# Patient Record
Sex: Female | Born: 1945 | Race: White | Hispanic: No | Smoking: Never smoker
Health system: Southern US, Community
[De-identification: ages and names within clinical notes are randomized; demographics above are authoritative.]

## PROBLEM LIST (undated history)

## (undated) DIAGNOSIS — Z86010 Personal history of colonic polyps: Secondary | ICD-10-CM

## (undated) DIAGNOSIS — K2900 Acute gastritis without bleeding: Secondary | ICD-10-CM

## (undated) DIAGNOSIS — M109 Gout, unspecified: Secondary | ICD-10-CM

## (undated) DIAGNOSIS — T7840XA Allergy, unspecified, initial encounter: Secondary | ICD-10-CM

## (undated) DIAGNOSIS — K5790 Diverticulosis of intestine, part unspecified, without perforation or abscess without bleeding: Secondary | ICD-10-CM

## (undated) DIAGNOSIS — E785 Hyperlipidemia, unspecified: Secondary | ICD-10-CM

## (undated) DIAGNOSIS — K449 Diaphragmatic hernia without obstruction or gangrene: Secondary | ICD-10-CM

## (undated) DIAGNOSIS — Z860101 Personal history of adenomatous and serrated colon polyps: Secondary | ICD-10-CM

## (undated) DIAGNOSIS — N6019 Diffuse cystic mastopathy of unspecified breast: Secondary | ICD-10-CM

## (undated) HISTORY — DX: Diverticulosis of intestine, part unspecified, without perforation or abscess without bleeding: K57.90

## (undated) HISTORY — DX: Allergy, unspecified, initial encounter: T78.40XA

## (undated) HISTORY — DX: Personal history of colonic polyps: Z86.010

## (undated) HISTORY — DX: Gout, unspecified: M10.9

## (undated) HISTORY — DX: Acute gastritis without bleeding: K29.00

## (undated) HISTORY — PX: COLONOSCOPY W/ POLYPECTOMY: SHX1380

## (undated) HISTORY — DX: Hyperlipidemia, unspecified: E78.5

## (undated) HISTORY — DX: Diaphragmatic hernia without obstruction or gangrene: K44.9

## (undated) HISTORY — DX: Diffuse cystic mastopathy of unspecified breast: N60.19

## (undated) HISTORY — DX: Personal history of adenomatous and serrated colon polyps: Z86.0101

## (undated) HISTORY — PX: EXPLORATORY LAPAROTOMY: SUR591

## (undated) HISTORY — PX: OTHER SURGICAL HISTORY: SHX169

## (undated) HISTORY — PX: COLONOSCOPY: SHX174

---

## 1998-09-07 ENCOUNTER — Other Ambulatory Visit: Admission: RE | Admit: 1998-09-07 | Discharge: 1998-09-07 | Payer: Self-pay | Admitting: Internal Medicine

## 2001-06-04 ENCOUNTER — Other Ambulatory Visit: Admission: RE | Admit: 2001-06-04 | Discharge: 2001-06-04 | Payer: Self-pay | Admitting: Gynecology

## 2002-06-10 ENCOUNTER — Other Ambulatory Visit: Admission: RE | Admit: 2002-06-10 | Discharge: 2002-06-10 | Payer: Self-pay | Admitting: Gynecology

## 2003-06-08 ENCOUNTER — Other Ambulatory Visit: Admission: RE | Admit: 2003-06-08 | Discharge: 2003-06-08 | Payer: Self-pay | Admitting: Gynecology

## 2004-01-10 ENCOUNTER — Encounter: Admission: RE | Admit: 2004-01-10 | Discharge: 2004-01-10 | Payer: Self-pay | Admitting: Family Medicine

## 2004-02-10 ENCOUNTER — Ambulatory Visit: Payer: Self-pay | Admitting: Internal Medicine

## 2004-06-26 ENCOUNTER — Ambulatory Visit: Payer: Self-pay | Admitting: Internal Medicine

## 2004-06-26 ENCOUNTER — Other Ambulatory Visit: Admission: RE | Admit: 2004-06-26 | Discharge: 2004-06-26 | Payer: Self-pay | Admitting: Gynecology

## 2005-01-04 ENCOUNTER — Ambulatory Visit: Payer: Self-pay | Admitting: Internal Medicine

## 2005-01-04 ENCOUNTER — Encounter: Admission: RE | Admit: 2005-01-04 | Discharge: 2005-01-04 | Payer: Self-pay | Admitting: Internal Medicine

## 2005-01-18 ENCOUNTER — Ambulatory Visit: Payer: Self-pay | Admitting: Internal Medicine

## 2005-07-31 ENCOUNTER — Other Ambulatory Visit: Admission: RE | Admit: 2005-07-31 | Discharge: 2005-07-31 | Payer: Self-pay | Admitting: Gynecology

## 2006-02-06 ENCOUNTER — Ambulatory Visit: Payer: Self-pay | Admitting: Internal Medicine

## 2006-04-01 HISTORY — PX: UPPER GI ENDOSCOPY: SHX6162

## 2006-08-19 ENCOUNTER — Other Ambulatory Visit: Admission: RE | Admit: 2006-08-19 | Discharge: 2006-08-19 | Payer: Self-pay | Admitting: Gynecology

## 2006-09-01 ENCOUNTER — Encounter: Payer: Self-pay | Admitting: Internal Medicine

## 2006-09-09 ENCOUNTER — Ambulatory Visit: Payer: Self-pay | Admitting: Internal Medicine

## 2006-09-11 ENCOUNTER — Ambulatory Visit: Payer: Self-pay | Admitting: Internal Medicine

## 2006-09-11 LAB — CONVERTED CEMR LAB
ALT: 29 units/L (ref 0–40)
AST: 27 units/L (ref 0–37)
Albumin: 3.5 g/dL (ref 3.5–5.2)
Alkaline Phosphatase: 74 units/L (ref 39–117)
BUN: 8 mg/dL (ref 6–23)
Basophils Absolute: 0 10*3/uL (ref 0.0–0.1)
Basophils Relative: 0.6 % (ref 0.0–1.0)
Bilirubin, Direct: 0.1 mg/dL (ref 0.0–0.3)
CO2: 31 meq/L (ref 19–32)
Calcium: 9.4 mg/dL (ref 8.4–10.5)
Chloride: 105 meq/L (ref 96–112)
Creatinine, Ser: 0.9 mg/dL (ref 0.4–1.2)
Eosinophils Absolute: 0.1 10*3/uL (ref 0.0–0.6)
Eosinophils Relative: 2.5 % (ref 0.0–5.0)
GFR calc Af Amer: 83 mL/min
GFR calc non Af Amer: 68 mL/min
Glucose, Bld: 83 mg/dL (ref 70–99)
HCT: 37.5 % (ref 36.0–46.0)
Hemoglobin: 13.1 g/dL (ref 12.0–15.0)
Lymphocytes Relative: 33.1 % (ref 12.0–46.0)
MCHC: 35 g/dL (ref 30.0–36.0)
MCV: 88.5 fL (ref 78.0–100.0)
Monocytes Absolute: 0.4 10*3/uL (ref 0.2–0.7)
Monocytes Relative: 7.6 % (ref 3.0–11.0)
Neutro Abs: 3.2 10*3/uL (ref 1.4–7.7)
Neutrophils Relative %: 56.2 % (ref 43.0–77.0)
Platelets: 289 10*3/uL (ref 150–400)
Potassium: 3.8 meq/L (ref 3.5–5.1)
RBC: 4.23 M/uL (ref 3.87–5.11)
RDW: 12.2 % (ref 11.5–14.6)
Sodium: 141 meq/L (ref 135–145)
TSH: 2.26 microintl units/mL (ref 0.35–5.50)
Total Bilirubin: 0.6 mg/dL (ref 0.3–1.2)
Total Protein: 6.6 g/dL (ref 6.0–8.3)
WBC: 5.6 10*3/uL (ref 4.5–10.5)

## 2006-09-18 LAB — CONVERTED CEMR LAB: Vit D, 1,25-Dihydroxy: 39 (ref 20–57)

## 2006-12-16 IMAGING — CR DG SHOULDER 2+V*R*
3 series · 3 of 3 positions shown · non-contrast
Comparison: None.
COMPARISON: None.

CLINICAL DATA: Bilateral hip and shoulder pain. 
 DIAGNOSTIC SHOULDER RIGHT ? 3 VIEW:

[w shoulder ap internal righ *]
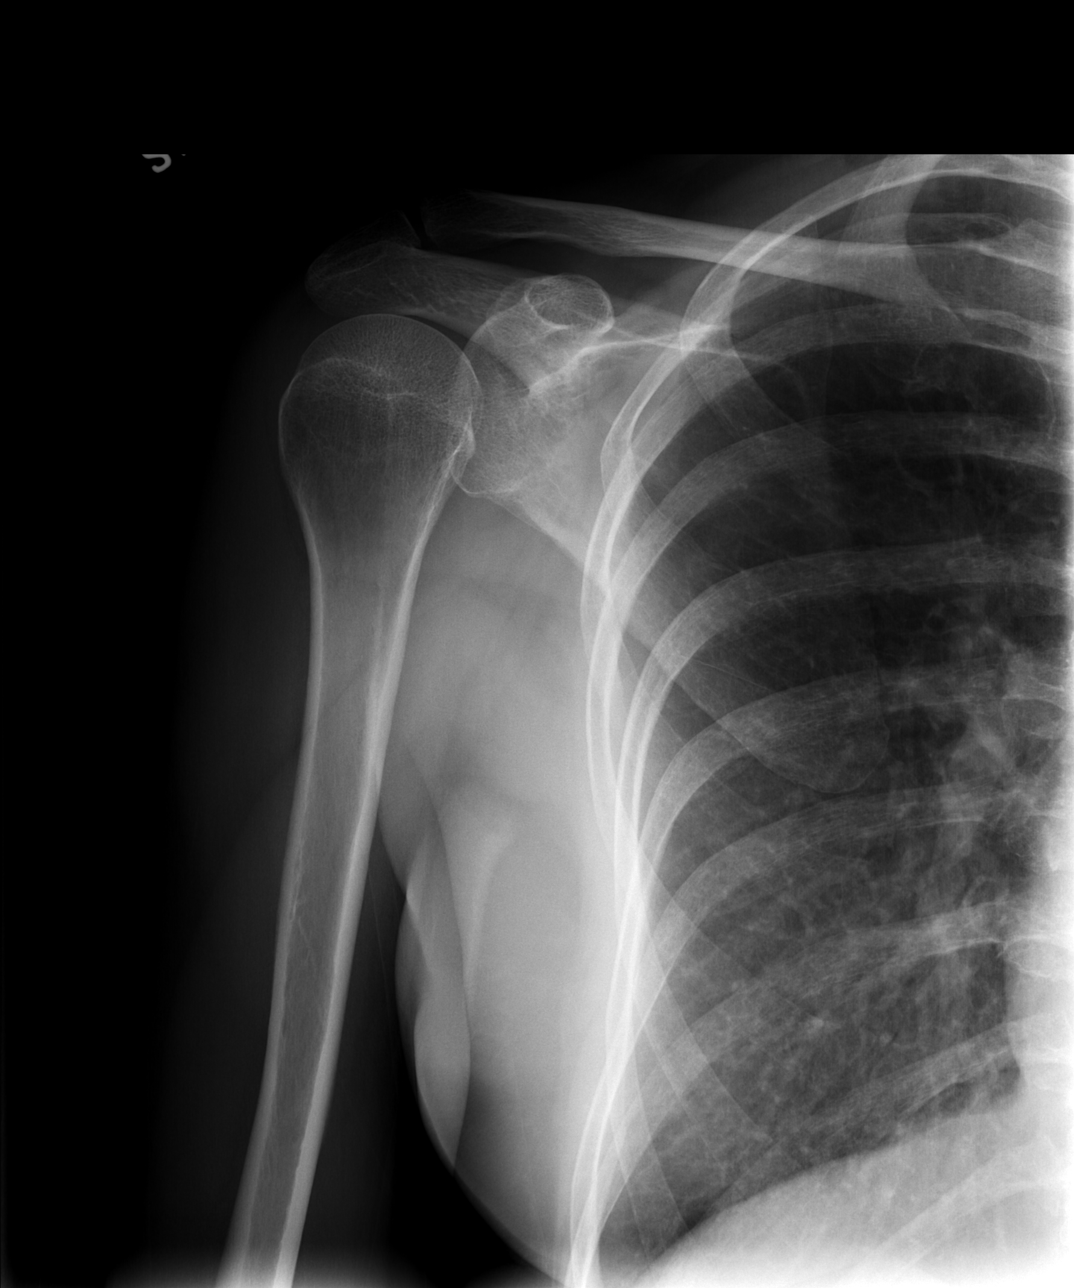

[w shoulder ap external righ *]
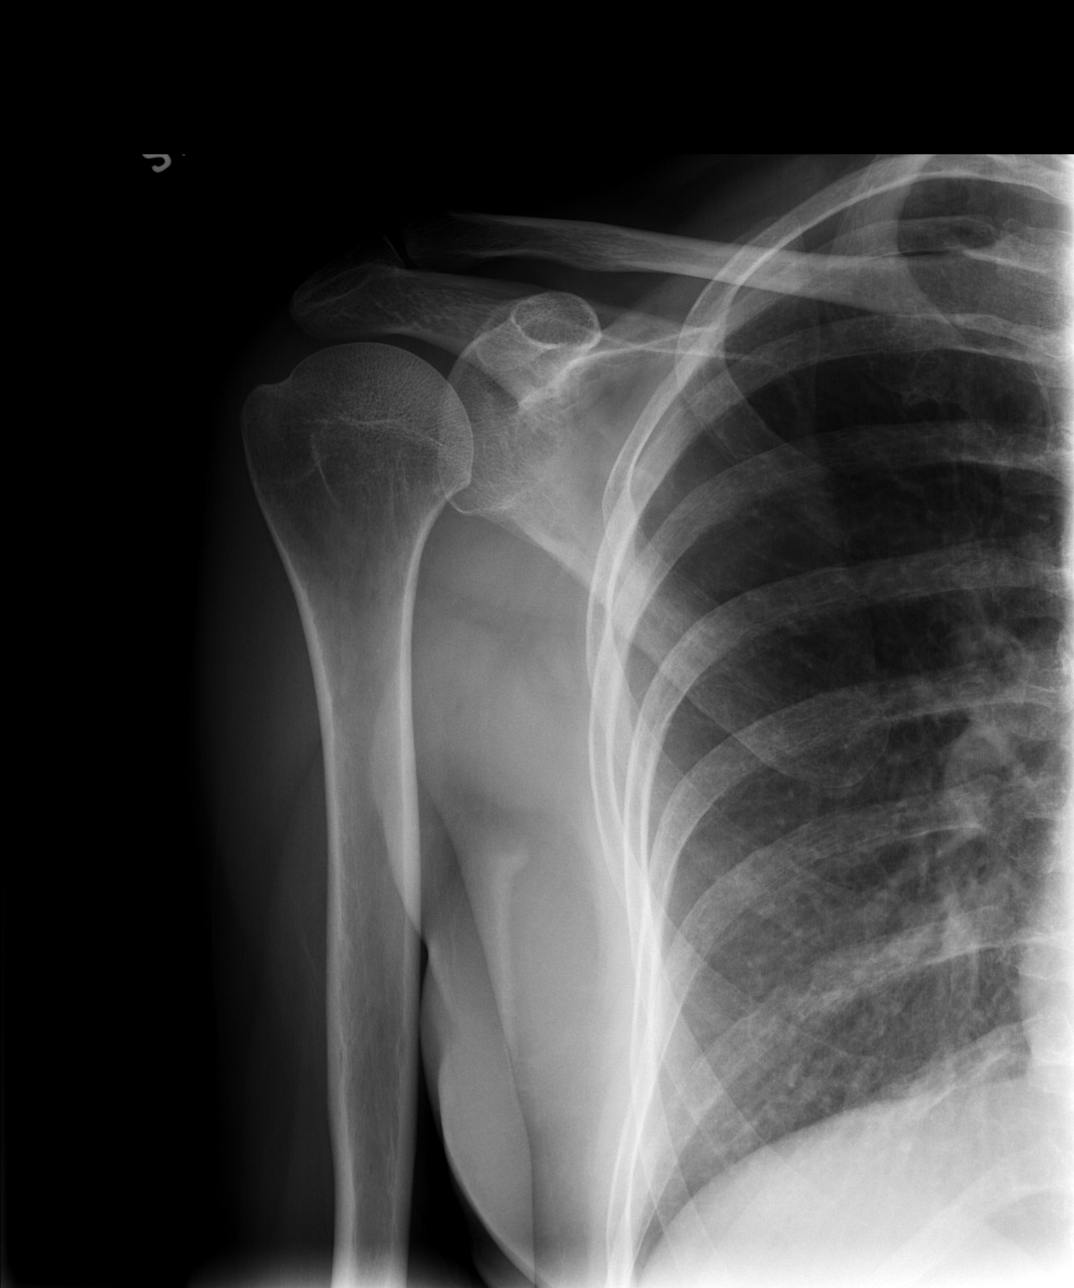

[w shoulder axillary right *]
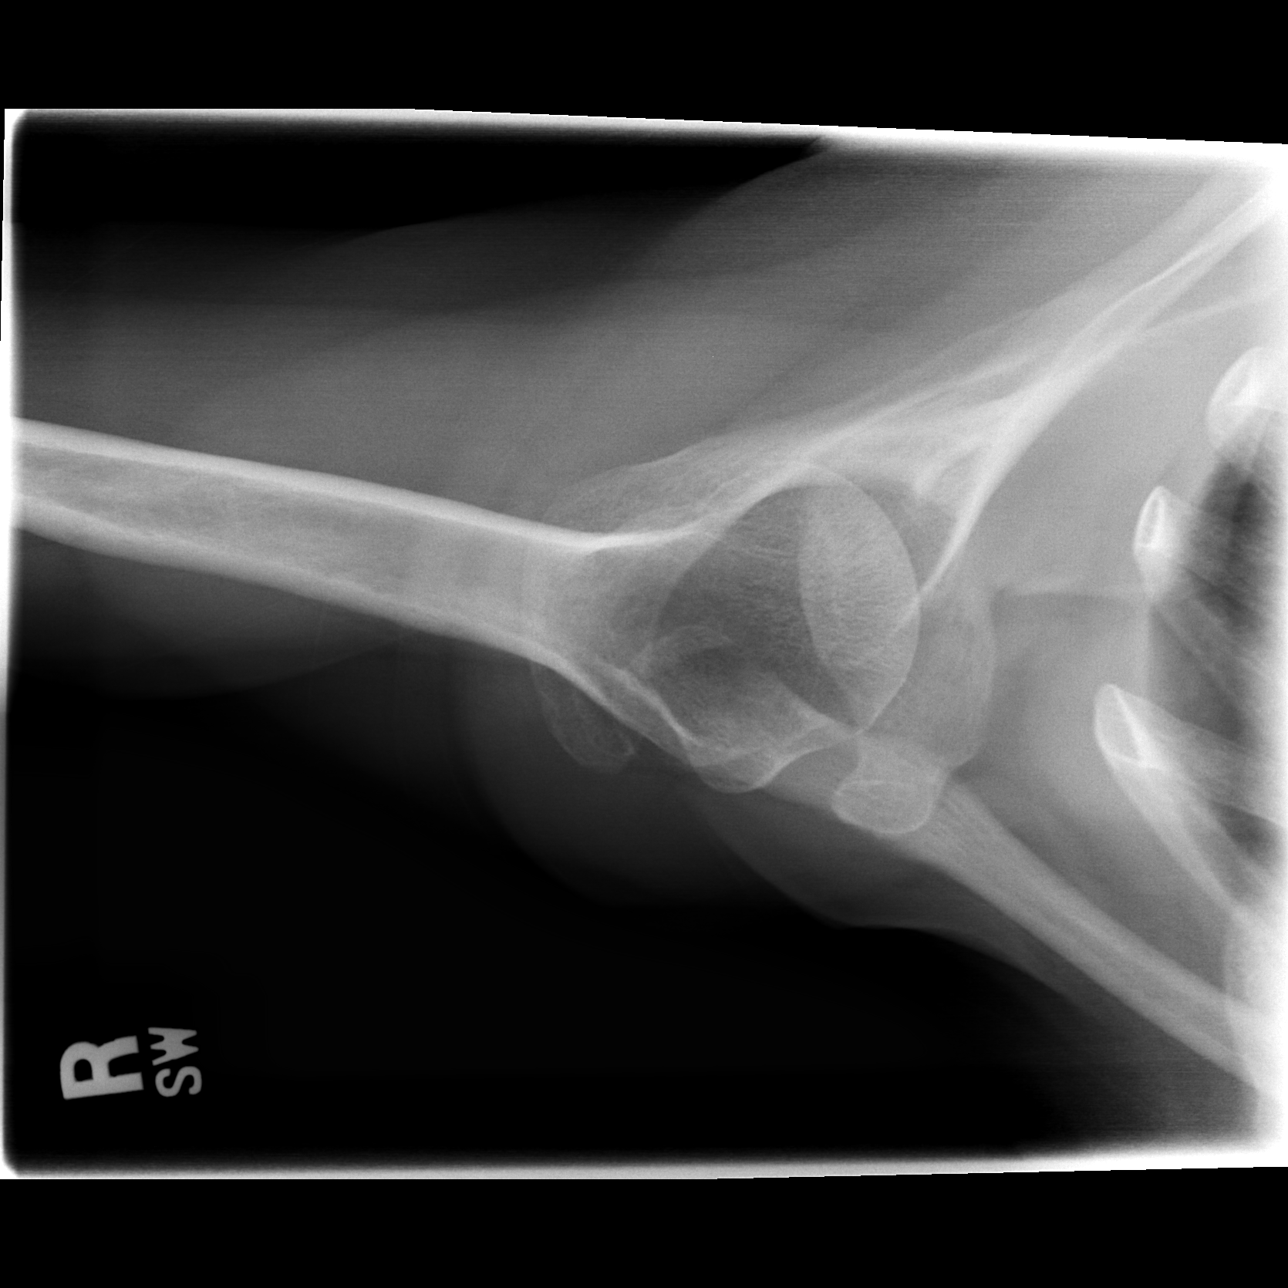

[3 of 3 positions shown; findings below may reference images not displayed]

There is no evidence of fracture or dislocation.  There is no evidence of arthropathy or other focal bone abnormality.  Soft tissues are unremarkable.
IMPRESSION: Negative.
 DIAGNOSTIC SHOULDER LEFT ? 3 VIEW:
There is no evidence of fracture or dislocation.  There is no evidence of arthropathy or other focal bone abnormality.  Soft tissues are unremarkable.
IMPRESSION: Negative.
 RIGHT HIP ? 3 VIEW:
 There is no evidence of hip fracture or dislocation.  There is no evidence of arthropathy or other focal bone abnormality.
IMPRESSION: Negative.
 LEFT HIP ? 2 VIEW:
 There is no evidence of hip fracture or dislocation.  There is no evidence of arthropathy or other focal bone abnormality.
IMPRESSION: Negative.

## 2007-01-12 ENCOUNTER — Ambulatory Visit: Payer: Self-pay | Admitting: Internal Medicine

## 2007-02-11 ENCOUNTER — Encounter: Payer: Self-pay | Admitting: Internal Medicine

## 2007-02-11 ENCOUNTER — Ambulatory Visit: Payer: Self-pay | Admitting: Internal Medicine

## 2007-02-11 DIAGNOSIS — K5730 Diverticulosis of large intestine without perforation or abscess without bleeding: Secondary | ICD-10-CM | POA: Insufficient documentation

## 2007-02-11 DIAGNOSIS — D126 Benign neoplasm of colon, unspecified: Secondary | ICD-10-CM

## 2007-06-10 DIAGNOSIS — K219 Gastro-esophageal reflux disease without esophagitis: Secondary | ICD-10-CM | POA: Insufficient documentation

## 2007-06-10 DIAGNOSIS — D131 Benign neoplasm of stomach: Secondary | ICD-10-CM

## 2007-06-10 DIAGNOSIS — K449 Diaphragmatic hernia without obstruction or gangrene: Secondary | ICD-10-CM | POA: Insufficient documentation

## 2007-08-20 ENCOUNTER — Other Ambulatory Visit: Admission: RE | Admit: 2007-08-20 | Discharge: 2007-08-20 | Payer: Self-pay | Admitting: Gynecology

## 2008-06-20 ENCOUNTER — Encounter (INDEPENDENT_AMBULATORY_CARE_PROVIDER_SITE_OTHER): Payer: Self-pay | Admitting: *Deleted

## 2008-07-07 ENCOUNTER — Ambulatory Visit: Payer: Self-pay | Admitting: Internal Medicine

## 2008-07-07 DIAGNOSIS — J309 Allergic rhinitis, unspecified: Secondary | ICD-10-CM | POA: Insufficient documentation

## 2008-07-07 DIAGNOSIS — E785 Hyperlipidemia, unspecified: Secondary | ICD-10-CM | POA: Insufficient documentation

## 2008-07-07 DIAGNOSIS — R9431 Abnormal electrocardiogram [ECG] [EKG]: Secondary | ICD-10-CM

## 2008-07-07 DIAGNOSIS — M25569 Pain in unspecified knee: Secondary | ICD-10-CM | POA: Insufficient documentation

## 2008-07-08 ENCOUNTER — Telehealth: Payer: Self-pay | Admitting: Internal Medicine

## 2008-07-08 ENCOUNTER — Ambulatory Visit: Payer: Self-pay | Admitting: Internal Medicine

## 2008-07-10 LAB — CONVERTED CEMR LAB
ALT: 17 units/L (ref 0–35)
AST: 19 units/L (ref 0–37)
Albumin: 3.9 g/dL (ref 3.5–5.2)
Alkaline Phosphatase: 49 units/L (ref 39–117)
BUN: 9 mg/dL (ref 6–23)
Basophils Absolute: 0 10*3/uL (ref 0.0–0.1)
Basophils Relative: 0.4 % (ref 0.0–3.0)
Bilirubin, Direct: 0.1 mg/dL (ref 0.0–0.3)
CO2: 29 meq/L (ref 19–32)
Calcium: 8.8 mg/dL (ref 8.4–10.5)
Chloride: 106 meq/L (ref 96–112)
Cholesterol: 217 mg/dL — ABNORMAL HIGH (ref 0–200)
Creatinine, Ser: 0.8 mg/dL (ref 0.4–1.2)
Direct LDL: 138.3 mg/dL
Eosinophils Absolute: 0.2 10*3/uL (ref 0.0–0.7)
Eosinophils Relative: 2.6 % (ref 0.0–5.0)
GFR calc non Af Amer: 77.66 mL/min (ref >60)
Glucose, Bld: 85 mg/dL (ref 70–99)
HCT: 41.1 % (ref 36.0–46.0)
HDL: 56.3 mg/dL (ref >39.00)
Hemoglobin: 14.1 g/dL (ref 12.0–15.0)
Lymphocytes Relative: 28.5 % (ref 12.0–46.0)
Lymphs Abs: 2 10*3/uL (ref 0.7–4.0)
MCHC: 34.3 g/dL (ref 30.0–36.0)
MCV: 93.4 fL (ref 78.0–100.0)
Monocytes Absolute: 0.5 10*3/uL (ref 0.1–1.0)
Monocytes Relative: 7.5 % (ref 3.0–12.0)
Neutro Abs: 4.4 10*3/uL (ref 1.4–7.7)
Neutrophils Relative %: 61 % (ref 43.0–77.0)
Platelets: 237 10*3/uL (ref 150.0–400.0)
Potassium: 3.6 meq/L (ref 3.5–5.1)
RBC: 4.4 M/uL (ref 3.87–5.11)
RDW: 11.3 % — ABNORMAL LOW (ref 11.5–14.6)
Rheumatoid fact SerPl-aCnc: 23.8 intl units/mL — ABNORMAL HIGH (ref 0.0–20.0)
Sed Rate: 11 mm/hr (ref 0–22)
Sodium: 140 meq/L (ref 135–145)
TSH: 2.33 microintl units/mL (ref 0.35–5.50)
Total Bilirubin: 0.7 mg/dL (ref 0.3–1.2)
Total CHOL/HDL Ratio: 4
Total Protein: 6.8 g/dL (ref 6.0–8.3)
Triglycerides: 110 mg/dL (ref 0.0–149.0)
Uric Acid, Serum: 4.8 mg/dL (ref 2.4–7.0)
VLDL: 22 mg/dL (ref 0.0–40.0)
WBC: 7.1 10*3/uL (ref 4.5–10.5)

## 2008-07-11 ENCOUNTER — Encounter (INDEPENDENT_AMBULATORY_CARE_PROVIDER_SITE_OTHER): Payer: Self-pay | Admitting: *Deleted

## 2008-07-11 ENCOUNTER — Telehealth (INDEPENDENT_AMBULATORY_CARE_PROVIDER_SITE_OTHER): Payer: Self-pay | Admitting: *Deleted

## 2008-07-14 ENCOUNTER — Encounter: Payer: Self-pay | Admitting: Internal Medicine

## 2008-07-18 ENCOUNTER — Encounter: Payer: Self-pay | Admitting: Internal Medicine

## 2008-07-18 ENCOUNTER — Telehealth (INDEPENDENT_AMBULATORY_CARE_PROVIDER_SITE_OTHER): Payer: Self-pay | Admitting: *Deleted

## 2008-07-25 ENCOUNTER — Ambulatory Visit: Payer: Self-pay

## 2008-07-25 ENCOUNTER — Ambulatory Visit: Payer: Self-pay | Admitting: Cardiovascular Disease

## 2009-09-22 ENCOUNTER — Ambulatory Visit: Payer: Self-pay | Admitting: Internal Medicine

## 2009-09-22 DIAGNOSIS — D492 Neoplasm of unspecified behavior of bone, soft tissue, and skin: Secondary | ICD-10-CM | POA: Insufficient documentation

## 2009-09-22 DIAGNOSIS — N959 Unspecified menopausal and perimenopausal disorder: Secondary | ICD-10-CM | POA: Insufficient documentation

## 2009-09-22 DIAGNOSIS — H5702 Anisocoria: Secondary | ICD-10-CM | POA: Insufficient documentation

## 2009-09-25 LAB — CONVERTED CEMR LAB
ALT: 20 units/L (ref 0–35)
AST: 22 units/L (ref 0–37)
Albumin: 4.3 g/dL (ref 3.5–5.2)
Alkaline Phosphatase: 57 units/L (ref 39–117)
BUN: 13 mg/dL (ref 6–23)
Basophils Absolute: 0 10*3/uL (ref 0.0–0.1)
Basophils Relative: 0.4 % (ref 0.0–3.0)
Bilirubin, Direct: 0.2 mg/dL (ref 0.0–0.3)
CO2: 31 meq/L (ref 19–32)
Calcium: 9.2 mg/dL (ref 8.4–10.5)
Chloride: 106 meq/L (ref 96–112)
Cholesterol: 237 mg/dL — ABNORMAL HIGH (ref 0–200)
Creatinine, Ser: 0.8 mg/dL (ref 0.4–1.2)
Direct LDL: 157.4 mg/dL
Eosinophils Absolute: 0.1 10*3/uL (ref 0.0–0.7)
Eosinophils Relative: 2.2 % (ref 0.0–5.0)
GFR calc non Af Amer: 76.25 mL/min (ref >60)
Glucose, Bld: 82 mg/dL (ref 70–99)
HCT: 41.8 % (ref 36.0–46.0)
HDL: 65.2 mg/dL (ref >39.00)
Hemoglobin: 14 g/dL (ref 12.0–15.0)
Lymphocytes Relative: 29.7 % (ref 12.0–46.0)
Lymphs Abs: 1.6 10*3/uL (ref 0.7–4.0)
MCHC: 33.5 g/dL (ref 30.0–36.0)
MCV: 94.6 fL (ref 78.0–100.0)
Monocytes Absolute: 0.5 10*3/uL (ref 0.1–1.0)
Monocytes Relative: 8.3 % (ref 3.0–12.0)
Neutro Abs: 3.3 10*3/uL (ref 1.4–7.7)
Neutrophils Relative %: 59.4 % (ref 43.0–77.0)
Platelets: 257 10*3/uL (ref 150.0–400.0)
Potassium: 4.1 meq/L (ref 3.5–5.1)
RBC: 4.42 M/uL (ref 3.87–5.11)
RDW: 12.3 % (ref 11.5–14.6)
Sodium: 142 meq/L (ref 135–145)
TSH: 1.47 microintl units/mL (ref 0.35–5.50)
Total Bilirubin: 0.8 mg/dL (ref 0.3–1.2)
Total CHOL/HDL Ratio: 4
Total Protein: 7 g/dL (ref 6.0–8.3)
Triglycerides: 127 mg/dL (ref 0.0–149.0)
VLDL: 25.4 mg/dL (ref 0.0–40.0)
Vit D, 25-Hydroxy: 47 ng/mL (ref 30–89)
WBC: 5.5 10*3/uL (ref 4.5–10.5)

## 2010-05-01 NOTE — Assessment & Plan Note (Signed)
Summary: cpx/fasting/kdc   Vital Signs:  Patient profile:   65 year old female Height:      67.25 inches Weight:      135.2 pounds BMI:     21.09 Temp:     97.4 degrees F oral Pulse rate:   60 / minute Resp:     14 per minute BP sitting:   104 / 62  (left arm) Cuff size:   large  Vitals Entered By: Shonna Chock (September 22, 2009 9:54 AM)  Comments REVIEWED MED LIST, PATIENT AGREED DOSE AND INSTRUCTION CORRECT    History of Present Illness: April House is here for a physical; she is asymptomatic but has been under  increased   pressure with family health issues  Preventive Screening-Counseling & Management  Caffeine-Diet-Exercise     Does Patient Exercise: no  Allergies (verified): No Known Drug Allergies  Past History:  Past Medical History: HYPERLIPIDEMIA (ICD-272.4) LDL 131 NONSPECIFIC ABNORMAL ELECTROCARDIOGRAM (ICD-794.31) ALLERGIC RHINITIS (ICD-477.9) KNEE PAIN, RIGHT (ICD-719.46) COLORECTAL CANCER, FAMILY HX (ICD-V16.0) COLONIC POLYPS, 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  GASTROESOPHAGEAL REFLUX DISEASE (ICD-530.81) GASTRITIS, ACUTE (ICD-535.00) GASTRIC POLYP (ICD-211.1) HIATAL HERNIA (ICD-553.3) DIVERTICULOSIS, COLON (ICD-562.10) FIBROCYSTIC BREAST DISEASE  Past Surgical History: G1 P0 ,1980 Colon polypectomy X 3, Dr Lina Sar Laparotomy-exploratory for endometriosis; D&C X2, Dr Teodora Medici Cosmetic Eye Surgery; Laser for pre  varicose vein; injectiions for spider veins BLE; Lasik , Dr Nile Riggs  Family History: Family History of OA 3 M aunts; 2 M aunts breast CA; P aunt lung CA MGF: RA, cns aneurysm ; Father: dementia, CAF Mother: HTN, CVA, excess weight, OA, asthma Siblings: bro: melanoma;  Social History: Never Smoked Occupation:  Psychologist, sport and exercise Married Alcohol use-yes: socially  Regular exercise-no due to family committments Does Patient Exercise:  no  Review of Systems       The patient complains of suspicious skin lesions.  The  patient denies anorexia, fever, vision loss, decreased hearing, hoarseness, chest pain, syncope, dyspnea on exertion, peripheral edema, prolonged cough, headaches, hemoptysis, abdominal pain, melena, hematochezia, severe indigestion/heartburn, hematuria, incontinence, unusual weight change, abnormal bleeding, enlarged lymph nodes, and angioedema.         Weight up 2-3 #. Slight tenderness inside L nare laterally X 8 months. General:  Complains of sleep disorder; Difficulty going  to sleep ;"can't shut mind down". MS:  Complains of joint pain and low back pain; denies joint redness, joint swelling, mid back pain, and thoracic pain; Compressed discs @ L4-5. Neuro:  Denies disturbances in coordination, falling down, numbness, poor balance, tingling, and weakness. Psych:  Denies anxiety, depression, easily angered, easily tearful, and irritability. Allergy:  Complains of itching eyes, seasonal allergies, and sneezing; Rx: Zyrtec , steroid spray.  Physical Exam  General:  Thin but  well-nourished, alert,appropriate and cooperative throughout examination Head:  Normocephalic and atraumatic without obvious abnormalities. Eyes:  No corneal or conjunctival inflammation noted. MINIMAL anisocoria OD > OS. Funduscopic exam benign, without hemorrhages, exudates or papilledema.  Ears:  External ear exam shows no significant lesions or deformities.  Otoscopic examination reveals clear canals, tympanic membranes are intact bilaterally without bulging, retraction, inflammation or discharge. Hearing is grossly normal bilaterally. Nose:  External nasal examination shows no deformity or inflammation. Nasal mucosa are pink and moist without lesions or exudates. I can not appreciate lesion Mouth:  Oral mucosa and oropharynx without lesions or exudates.  Teeth in good repair. Neck:  No deformities, masses, or tenderness noted. Lungs:  Normal respiratory effort, chest expands symmetrically.  Lungs are clear to  auscultation, no crackles or wheezes. Heart:  Normal rate and regular rhythm. S1 and S2 normal without gallop, murmur, click, rub .Soft S 4 Abdomen:  Bowel sounds positive,abdomen soft and non-tender without masses, organomegaly or hernias noted. Genitalia:  Dr Chevis Pretty Msk:  No deformity or scoliosis noted of thoracic or lumbar spine.   Pulses:  R and L carotid,radial,dorsalis pedis and posterior tibial pulses are full and equal bilaterally Extremities:  No clubbing, cyanosis, edema, or deformity noted with normal full range of motion of all joints.   Neg SLR to 90 degrees Neurologic:  alert & oriented X3, strength normal in all extremities, and DTRs symmetrical and normal.   Skin:  Intact without suspicious lesions or rashes Cervical Nodes:  No lymphadenopathy noted Axillary Nodes:  No palpable lymphadenopathy Psych:  memory intact for recent and remote, normally interactive, and good eye contact.     Impression & Recommendations:  Problem # 1:  ROUTINE GENERAL MEDICAL EXAM@[email or web address]  CARE FACL (ICD-V70.0)  Orders: EKG w/ Interpretation (93000) Venipuncture (47425) TLB-Lipid Panel (80061-LIPID) TLB-BMP (Basic Metabolic Panel-BMET) (80048-METABOL) TLB-CBC Platelet - w/Differential (85025-CBCD) TLB-Hepatic/Liver Function Pnl (80076-HEPATIC) TLB-TSH (Thyroid Stimulating Hormone) (84443-TSH) T-Vitamin D (25-Hydroxy) (95638-75643)  Problem # 2:  NEOPLASMS UNSPEC NATURE BONE SOFT TISSUE&SKIN (ICD-239.2) Left  nare; clinically not appreciated  Problem # 3:  ANISOCORIA (ICD-379.41) minimal  Problem # 4:  HYPERLIPIDEMIA (ICD-272.4)  Due to high HDL  Orders: EKG w/ Interpretation (93000) Venipuncture (32951) TLB-Lipid Panel (80061-LIPID)  Problem # 5:  ALLERGIC RHINITIS (ICD-477.9)  Her updated medication list for this problem includes:    Zyrtec 10 Mg Tabs (Cetirizine hcl) .Marland Kitchen... 1 by mouth qhs    Flonase 50 Mcg/act Susp (Fluticasone propionate) ..... Seasonal use    Astepro 0.15 %  Soln (Azelastine hcl) .Marland Kitchen... 1 spray two times a day as needed  Orders: Venipuncture (88416)  Problem # 6:  COLONIC POLYPS, ADENOMATOUS (ICD-211.3) as per Dr Juanda Chance  Problem # 7:  POSTMENOPAUSAL SYNDROME (ICD-627.9)  The following medications were removed from the medication list:    Combipatch 0.05-0.14 Mg/day Pttw (Estradiol-norethindrone acet) .Marland Kitchen... As directed  Orders: T-Vitamin D (25-Hydroxy) [phone])  Complete Medication List: 1)  Zyrtec 10 Mg Tabs (Cetirizine hcl) .Marland Kitchen.. 1 by mouth qhs 2)  Neurontin 100 Mg Caps (Gabapentin) .... 2 by mouth at bedtime 3)  Prilosec Otc 20 Mg Tbec (Omeprazole magnesium) .Marland Kitchen.. 1 by mouth once daily as needed 4)  Flonase 50 Mcg/act Susp (Fluticasone propionate) .... Seasonal use 5)  Calcium 600 Mg Tabs (Calcium) .Marland Kitchen.. 1 by mouth two times a day 6)  Multivitamins Tabs (Multiple vitamin) .Marland Kitchen.. 1 by mouth once daily 7)  Astepro 0.15 % Soln (Azelastine hcl) .Marland Kitchen.. 1 spray two times a day as needed 8)  Bactroban 2 % Crea (Mupirocin calcium) .... Apply two times a day  Patient Instructions: 1)  See ENT if nasal lesion persists.See your eye doctor yearly ; have him assess the minimal anisocoria. Prescriptions: BACTROBAN 2 % CREA (MUPIROCIN CALCIUM) apply two times a day  #15 grams x 0   Entered and Authorized by:   Marga Melnick MD   Signed by:   Marga Melnick MD on 09/22/2009   Method used:   Print then Give to Patient   RxID:   [id number] ASTEPRO 0.15 % SOLN (AZELASTINE HCL) 1 spray two times a day as needed  #3 x 3   Entered and Authorized by:   Marga Melnick MD   Signed by:  Marga Melnick MD on 09/22/2009   Method used:   Print then Give to Patient   RxID:   [id number] FLONASE 50 MCG/ACT SUSP (FLUTICASONE PROPIONATE) SEASONAL USE  #3 x 3   Entered and Authorized by:   Marga Melnick MD   Signed by:   Marga Melnick MD on 09/22/2009   Method used:   Print then Give to Patient   RxID:   [id number] NEURONTIN 100 MG CAPS  (GABAPENTIN) 2 by mouth at bedtime  #180 x 3   Entered and Authorized by:   Marga Melnick MD   Signed by:   Marga Melnick MD on 09/22/2009   Method used:   Print then Give to Patient   RxID:   [id number]

## 2010-08-03 ENCOUNTER — Encounter: Payer: Self-pay | Admitting: Internal Medicine

## 2010-08-14 NOTE — Assessment & Plan Note (Signed)
[facility]                         GASTROENTEROLOGY OFFICE NOTE   IZADORA, ROEHR                     MRN:          [id number]  DATE:01/12/2007                            DOB:          30-Aug-1945    Ms. April House is a 65 year old patient of Dr. Frederik Pear who is here today  to discuss 2 problems.  One is episode of rectal bleeding.  Another is  symptoms of gastroesophageal reflux, which bother her several times a  week and are greatly relieved on ranitidine.  We saw Cop[facility] in 2000 for a  colonoscopy.  She had an adenomatous polyp and a subsequent colonoscopy  in September of 2003, which was essentially normal.  She has a positive  family history of colon cancer in both mother, on mother's side, and  father's side.  Mother's and father's siblings have colon cancer.  She  has normally irregular bowel habits but once recently became constipated  and strained, and developed some rectal bleeding.  She denies any  abdominal pain.  She now experiences food backing up at night, having  some cough at times and discomfort.  It could be partly controlled by  avoiding certain foods, but if she eats vegetables or certain acidic  foods, she is left with discomfort substernally.  There also has been  some pill dysphagia.  While taking ranitidine 150 mg a day for the past  6 to 8 weeks, her symptoms have subsided, but come back if she stops  taking it.  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   MEDICATIONS:  1. Neurontin 100 mg 2 p.o. daily.  2. CombiPatch 0.5 mg twice weekly combined with normethadone 0.14 mg.  3. Zyrtec OTC.  4. Rhinocort p.r.n.  5. Ranitidine 150 mg p.r.n.   PAST HISTORY:  Significant for allergies, arthritic complaints.  She  used to take Lodine 1 a day, given to her by Dr. Renae Fickle between December  2006 and April 2007.   FAMILY HISTORY:  Already mentioned in the history of present illness.   SOCIAL HISTORY:  Married.  No children.  She has college education.  She  works as a Lawyer.  She does not smoke.  Drinks alcohol socially.   REVIEW OF SYSTEMS:  Positive for allergies, arthritic complaints.   PHYSICAL EXAM:  Blood pressure 102/60, pulse 72, and weight 137 pounds.  She was alert and oriented, in no distress.  LUNGS:  Clear to auscultation.  COR:  Normal S1, normal S2.  ABDOMEN:  Soft, nontender with normoactive bowel sounds.  Liver edge at  costal margin.  Lower abdomen was normal.  RECTAL:  Normal rectal tone.  Stool was Hemoccult negative.   IMPRESSION:  37. A 65 year old white female with new onset of symptoms of      gastroesophageal reflux, which might have been aggravated by the      fact that she took Lodine.  Although she has been off Lodine, her      symptoms persist.  Rule out hiatal hernia.  Rule out H. pylori  gastropathy.  2. Hematochezia x1.  Rule out hemorrhoids, anal fistula.  3. Personal history of adenomatous polyp of the colon in 2000.  4. Positive family history in 2 indirect relatives on mother's and      father's sides.   PLAN:  1. Upper endoscopy scheduled.  2. Continue ranitidine 150 mg daily.  We will decide if she needs to      take it on a daily basis or only p.r.n.  3. Colonoscopy scheduled.  4. Continue high-fiber diet.     Hedwig Morton. Juanda Chance, MD  Electronically Signed    DMB/MedQ  DD: 01/12/2007  DT: 01/13/2007  Job #: [id number]   cc:   Titus Dubin. Alwyn Ren, MD,FACP,FCCP

## 2010-08-14 NOTE — Assessment & Plan Note (Signed)
[facility]                        [place] OFFICE NOTE   April House, April House                     MRN:          [id number]  DATE:09/09/2006                            DOB:          [birth year]    SUBJECTIVE:  April House was seen on September 09, 2006, with for  comprehensive evaluation.  She does have acute issues of bursitis and  pain related to vertebral compression fractures.  She has been followed  by Dr. Deidre Ala and is now on Neurontin 300 mg b.i.d. to t.i.d.  She has no associated paresthesias or stool/urinary incontinence.  She  does have  extrinsic  rhinitis and has been on Zyrtec and Astelin with  good control.  She has seen Dr. Ashok Croon, allergist.  With avoidance she  has had good control of symptoms.  She does have a neti pot but has not  been using it regularly.   She has had a weight gain; thyroid function tests done by Dr Chevis Pretty  recently were normal  by her report.  Additionally she has had a bone  density;results are pending.   PAST MEDICAL HISTORY:  Unchanged.  She is gravida 1, para 0. D&C  x2.  She had a laparoscopic procedure prior to her pregnancy.  Her initial  colonoscopy showed hyperplastic polyps.  Followup was negative. She  believes that she is due for a repeat colonoscopy by Dr. Hedwig Morton. [place].   Significantly, she did have rectal bleeding in [place] in February.  This was in the context of a hard stool.  This has not recurred, and she  denies melena, dysphagia, or other GI symptoms of any significance.  She  will have some nocturnal  dyspepsia symptoms after reclining, 2 to 3  times a week.  This has not been treated.   FAMILY HISTORY:  Includes colon cancer and breast cancer in a maternal  aunt.  Prostate cancer in the maternal grandfather.  Melanoma in her  brother.  Stroke and and a CNS aneurysm in the maternal grandfather.  He  also had osteoarthritis.  Father has a cognitive disorder and a  dysrhythmia and prostatic hypertrophy.  The mother has degenerative  joint disease, hypertension and has had a stroke.  She is on Plavix.  Two cousins have rheumatoid arthritis.   SOCIAL HISTORY:  She has never smoked.  She drinks socially.  She  engages in Pilates three times a week.   ALLERGIES:  No known drug allergies.   MEDICATIONS:  1. Calium.  2. Vitamin D.  3. Vagifem.   REVIEW OF SYSTEMS:  As outlined above.   PHYSICAL EXAMINATION:  VITAL SIGNS:  Weight 140 pounds, up 4 pounds  since October 2006.  Blood pressure 97/68, respirations 12.  GENERAL:  She appears healthy and well-nourished.  HEENT:  Fundal exam reveals essentially normal vasculature.  Otolaryngologic exam is unremarkable, despite her extrinsic history.  Dental hygiene is excellent.  NECK:  Thyroid is upper limits of normal without nodularity.  NODES:  She has no lymphadenopathy about the head, neck or axillae.  CHEST:  Clear to auscultation, with no  increased work of breathing.  HEART:  She has no murmurs or gallops.  All pulses are intact.  ABDOMEN:  She has no organomegaly or masses or abdominal tenderness.  SKIN:  Clear of any significant lesions.  EXTREMITIES:  Deep tendon reflexes are normal.  NEUROPSYCHIATRIC:  The evaluation is normal.  Her strength and range of  motion are normal despite the positive history as noted above.   Electrocardiogram is essentially normal.  Minor loss of T-voltage  diffusely.   PLAN/RECOMMENDATIONS:  Review of the chart reveals mildly elevated LDL  of 131 with HDL of 50.  An  NMR lipo profile will be recommended to  optimally assess her risk.  Additionally, a vitamin D level will be  performed, because of her history of vertebral compression fracture.   For reflux, it will be recommended that she avoid the aspirin family,  alcohol, peppermint, tobacco and caffeine, coffee, tea, cola and  chocolate as triggers;Tylenol is okay.  She should not eat within two to  three  hours of going to bed.   An abbreviated course of ranitidine 150 mg b.i.d. will be prescribed on  an as-needed basis.  If the symptoms persist, then further evaluation  will be recommended by Dr. Lina Sar.   There is a history of rectal bleeding.  A GI consultation is  recommended.  It is anticipated that a colonoscopy will be performed.  An endoscopy can be discussed, based upon her response to the H2  blocker.   A Neti pot on a regular basis would be recommended, if she has any  congestion, to prevent exacerbation of her extrinsic rhinitis.   The web site prevention.com The Flat Belly Diet will be recommended as  a reference for weight loss.     Titus Dubin. Alwyn Ren, MD,FACP,FCCP  Electronically Signed    [facility]/MedQ  DD: 09/09/2006  DT: 09/09/2006  Job #: [id number]

## 2010-09-24 ENCOUNTER — Encounter: Payer: Self-pay | Admitting: Internal Medicine

## 2011-01-03 ENCOUNTER — Encounter: Payer: Self-pay | Admitting: Internal Medicine

## 2011-01-04 ENCOUNTER — Ambulatory Visit (INDEPENDENT_AMBULATORY_CARE_PROVIDER_SITE_OTHER): Payer: BC Managed Care – PPO | Admitting: Internal Medicine

## 2011-01-04 ENCOUNTER — Encounter: Payer: Self-pay | Admitting: Internal Medicine

## 2011-01-04 DIAGNOSIS — D126 Benign neoplasm of colon, unspecified: Secondary | ICD-10-CM

## 2011-01-04 DIAGNOSIS — Z0000 Encounter for general adult medical examination without abnormal findings: Secondary | ICD-10-CM

## 2011-01-04 DIAGNOSIS — Z23 Encounter for immunization: Secondary | ICD-10-CM

## 2011-01-04 DIAGNOSIS — M8580 Other specified disorders of bone density and structure, unspecified site: Secondary | ICD-10-CM | POA: Insufficient documentation

## 2011-01-04 DIAGNOSIS — Z136 Encounter for screening for cardiovascular disorders: Secondary | ICD-10-CM

## 2011-01-04 DIAGNOSIS — M949 Disorder of cartilage, unspecified: Secondary | ICD-10-CM

## 2011-01-04 DIAGNOSIS — E785 Hyperlipidemia, unspecified: Secondary | ICD-10-CM

## 2011-01-04 LAB — CBC WITH DIFFERENTIAL/PLATELET
Basophils Absolute: 0 10*3/uL (ref 0.0–0.1)
Basophils Relative: 0.5 % (ref 0.0–3.0)
Eosinophils Absolute: 0.3 10*3/uL (ref 0.0–0.7)
Eosinophils Relative: 5.3 % — ABNORMAL HIGH (ref 0.0–5.0)
HCT: 39.7 % (ref 36.0–46.0)
Hemoglobin: 13.5 g/dL (ref 12.0–15.0)
Lymphocytes Relative: 32.5 % (ref 12.0–46.0)
Lymphs Abs: 2 10*3/uL (ref 0.7–4.0)
MCHC: 34 g/dL (ref 30.0–36.0)
MCV: 90.8 fl (ref 78.0–100.0)
Monocytes Absolute: 0.5 10*3/uL (ref 0.1–1.0)
Monocytes Relative: 7.7 % (ref 3.0–12.0)
Neutro Abs: 3.3 10*3/uL (ref 1.4–7.7)
Neutrophils Relative %: 54 % (ref 43.0–77.0)
Platelets: 245 10*3/uL (ref 150.0–400.0)
RBC: 4.37 Mil/uL (ref 3.87–5.11)
RDW: 12.9 % (ref 11.5–14.6)
WBC: 6.1 10*3/uL (ref 4.5–10.5)

## 2011-01-04 LAB — BASIC METABOLIC PANEL
BUN: 10 mg/dL (ref 6–23)
CO2: 28 mEq/L (ref 19–32)
Calcium: 9.1 mg/dL (ref 8.4–10.5)
Chloride: 100 mEq/L (ref 96–112)
Creatinine, Ser: 0.8 mg/dL (ref 0.4–1.2)
GFR: 78.15 mL/min (ref >60.00)
Glucose, Bld: 68 mg/dL — ABNORMAL LOW (ref 70–99)
Potassium: 3.9 mEq/L (ref 3.5–5.1)
Sodium: 137 mEq/L (ref 135–145)

## 2011-01-04 LAB — HEPATIC FUNCTION PANEL
ALT: 15 U/L (ref 0–35)
AST: 17 U/L (ref 0–37)
Albumin: 4.4 g/dL (ref 3.5–5.2)
Alkaline Phosphatase: 65 U/L (ref 39–117)
Bilirubin, Direct: 0.1 mg/dL (ref 0.0–0.3)
Total Bilirubin: 0.7 mg/dL (ref 0.3–1.2)
Total Protein: 7.1 g/dL (ref 6.0–8.3)

## 2011-01-04 LAB — TSH: TSH: 1.25 u[IU]/mL (ref 0.35–5.50)

## 2011-01-04 MED ORDER — GABAPENTIN 100 MG PO CAPS: 100.0000 mg | ORAL_CAPSULE | ORAL | Status: DC

## 2011-01-04 MED ORDER — CITALOPRAM HYDROBROMIDE 20 MG PO TABS: 20.0000 mg | ORAL_TABLET | ORAL | Status: DC

## 2011-01-04 MED ORDER — CITALOPRAM HYDROBROMIDE 20 MG PO TABS: 20.0000 mg | ORAL_TABLET | ORAL | Status: AC

## 2011-01-04 NOTE — Patient Instructions (Addendum)
The triggers for dyspepsia or "heart burn"  include stress; the "aspirin family" ; alcohol; peppermint; and caffeine (coffee, tea, cola, and chocolate). The aspirin family would include aspirin and the nonsteroidal agents such as ibuprofen &  Naproxen. Tylenol would not cause reflux. If having dyspepsia ; food & drink should be avoided for @ least 2 hours before going to bed.  To prevent sleep dysfunction follow these instructions for sleep hygiene. Do not read, watch TV, or eat in bed. Do not get into bed until you are ready to turn off the light &  to go to sleep. Do not ingest stimulants ( decongestants, diet pills, nicotine, caffeine) after the evening meal.  Eat a low-fat diet with lots of fruits and vegetables, up to 7-9 servings per day. Normal T scores on a bone density exam (BMD)  are +1 to -1. Osteopenia would be -1.1 to -2.4. Osteoporosis is defined by a  T score worse than -2.4. Treatment should be considered  with  T scores worse than -1.5, particularly if there is  family history of low bone density or personal  past history of atypical fractue.BMD should be monitored every 25 months. Recommended lifestyle interventions to prevent  Osteoporosis include calcium 600 mg twice a day  & vitamin D3 supplementation to keep vitamin  D  level @ least 40-60. The usual vitamin D3 dose is 1000 IU daily; but individual dose is determined by annual vitamin D level monitor. Also weight bearing exercise such as  walking 30-45 minutes 3-4  X per week is recommended.  [employer]

## 2011-01-04 NOTE — Progress Notes (Signed)
Subjective:    Patient ID: April House, female    DOB: 06/20/1945, 65 y.o.   MRN: [id number]  HPI  April House  is here for a physical;acute issues sleep dysfunction for 4-5 months.      Review of Systems  Insomnia: Pattern: Difficulty going to sleep:yes , "mind racing" Frequent awakening:3 am Early awakening:yes Nightmares:no Abnormal leg movement:no Snoring:no Apnea:no Risk factors/sleep hygiene: Stimulants:no Alcohol intake:no Reading, watching TV, eating @ bedtime:reading in bed Daytime naps:no Stress/anxiety:health of parents, father died 2011/01/06 Work/travel factors:no Impact: Daytime hypersomnolence: no Motor vehicle accident/motor dysfunction:no Treatment to date/efficacy: on Neurontin 200 mg @ bedtime for DDD ; Zyrtec for rhinitis.  She is concerned about a lesion in  the left nare. It did not respond to topical antibiotic therapy.        Objective:   Physical Exam Gen.: Healthy and well-nourished in appearance. Alert, appropriate and cooperative throughout exam. Head: Normocephalic without obvious abnormalities Eyes: No corneal or conjunctival inflammation noted. Pupils equal round reactive to light ( no definite anisocoria) and accommodation. Fundal exam is benign without hemorrhages, exudate, papilledema. Extraocular motion intact. Vision grossly normal. Ears: External  ear exam reveals no significant lesions or deformities. Canals clear .TMs normal. Hearing is grossly decreased to whisper @ 6 ft bilaterally; R worse than L. Nose: External nasal exam reveals no deformity or inflammation. Nasal mucosa are pink and moist. No lesions or exudates noted. Septum  normal; there appears to be some debris in  the left nares along the lateral wall  Mouth: Oral mucosa and oropharynx reveal no lesions or exudates. Teeth in good repair. Neck: No deformities, masses, or tenderness noted. Range of motion &. Thyroid normal. Lungs: Normal respiratory effort; chest expands  symmetrically. Lungs are clear to auscultation without rales, wheezes, or increased work of breathing. Heart: Normal rate and rhythm. Normal S1 and S2. No gallop, click, or rub. Soft S 4 ; no  murmur. Abdomen: Bowel sounds normal; abdomen soft and nontender. No masses, organomegaly or hernias noted. Genitalia: Dr Audrie Lia   .                                                                                   Musculoskeletal/extremities: Slight lordosis  noted of  the thoracic spine. No clubbing, cyanosis, edema, or deformity noted. Range of motion  normal .Tone & strength  normal.Joints normal. Nail health  good. Small ganglion cyst suggested in the arch of the feet, left larger than right. Keratotic lesion of the dorsum of the left foot. Vascular: Carotid, radial artery, dorsalis pedis and  posterior tibial pulses are full and equal. No bruits present. Neurologic: Alert and oriented x3. Deep tendon reflexes symmetrical and normal.          Skin: Intact without suspicious lesions or rashes. Lymph: No cervical, axillary, or inguinal lymphadenopathy present. Psych: Mood and affect are normal. Normally interactive  Assessment & Plan:  #1 comprehensive physical exam; no acute findings #2 see Problem List with Assessments & Recommendations  #3 probable granulomatous tissue in the left nare  #4 decreased untoward acuity to whisper and 60 Plan: see Orders   ENT evaluation of the nasal lesion and possible hearing loss  appropriate.

## 2011-01-05 LAB — VITAMIN D 25 HYDROXY (VIT D DEFICIENCY, FRACTURES): Vit D, 25-Hydroxy: 40 ng/mL (ref 30–89)

## 2011-12-30 ENCOUNTER — Encounter: Payer: Self-pay | Admitting: *Deleted

## 2012-01-07 ENCOUNTER — Encounter: Payer: Self-pay | Admitting: Internal Medicine

## 2012-01-07 ENCOUNTER — Encounter: Payer: BC Managed Care – PPO | Admitting: Internal Medicine

## 2012-02-05 ENCOUNTER — Ambulatory Visit (INDEPENDENT_AMBULATORY_CARE_PROVIDER_SITE_OTHER): Payer: BC Managed Care – PPO | Admitting: Internal Medicine

## 2012-02-05 ENCOUNTER — Encounter: Payer: Self-pay | Admitting: Internal Medicine

## 2012-02-05 VITALS — BP 114/78 | HR 74 | Temp 98.3°F | Resp 12 | Ht 67.0 in | Wt 131.4 lb

## 2012-02-05 DIAGNOSIS — E785 Hyperlipidemia, unspecified: Secondary | ICD-10-CM

## 2012-02-05 DIAGNOSIS — Z0000 Encounter for general adult medical examination without abnormal findings: Secondary | ICD-10-CM

## 2012-02-05 DIAGNOSIS — Z23 Encounter for immunization: Secondary | ICD-10-CM

## 2012-02-05 LAB — SEDIMENTATION RATE: Sed Rate: 16 mm/hr (ref 0–22)

## 2012-02-05 LAB — LIPID PANEL
Total CHOL/HDL Ratio: 4
Triglycerides: 174 mg/dL — ABNORMAL HIGH (ref 0.0–149.0)
VLDL: 34.8 mg/dL (ref 0.0–40.0)

## 2012-02-05 LAB — LDL CHOLESTEROL, DIRECT: Direct LDL: 165.3 mg/dL

## 2012-02-05 LAB — TSH: TSH: 1.58 u[IU]/mL (ref 0.35–5.50)

## 2012-02-05 MED ORDER — GABAPENTIN 100 MG PO CAPS: ORAL_CAPSULE | ORAL | Status: DC

## 2012-02-05 MED ORDER — FLUTICASONE PROPIONATE 50 MCG/ACT NA SUSP: NASAL | Status: AC

## 2012-02-05 NOTE — Progress Notes (Signed)
  Subjective:    Patient ID: April House, female    DOB: 02/21/1946, 66 y.o.   MRN: [id number]  HPI April House is here for a physical;acute issues includes  arthralgias .    Review of Systems She describes pain in the right knee, toes, fingers, and shoulders. This is worse in the morning for approximately 10 minutes. She also knee pain @ night on  occasion. She describes perennial rhinitis which is worse seasonally. She uses Flonase as needed. This has caused epistaxis. She also takes Zyrtec. She is not had significant extrinsic symptoms such as itchy, watery eyes or sneezing. She also denies wheezing. Reflux is controlled by avoidance of triggers and as needed over-the-counter PPI.    Objective:   Physical Exam Gen.: Thin but healthy and well-nourished in appearance. Alert, appropriate and cooperative throughout exam. Head: Normocephalic without obvious abnormalities  Eyes: No corneal or conjunctival inflammation noted. OD pupil > OS minimally.  Fundal exam is benign without hemorrhages, exudate, papilledema. Extraocular motion intact. Vision grossly normal. Ears: External  ear exam reveals no significant lesions or deformities. Canals clear .TMs normal. Hearing is grossly decreased  bilaterally. Nose: External nasal exam reveals no deformity or inflammation. Nasal mucosa are pink and moist. No lesions or exudates noted.  Mouth: Oral mucosa and oropharynx reveal no lesions or exudates. Teeth in good repair. Neck: No deformities, masses, or tenderness noted. Range of motion decreased. Thyroid normal. Lungs: Normal respiratory effort; chest expands symmetrically. Lungs are clear to auscultation without rales, wheezes, or increased work of breathing. Heart: Normal rate and rhythm. Normal S1 and S2. No gallop, click, or rub. No murmur. Abdomen: Bowel sounds normal; abdomen soft and nontender. No masses, organomegaly or hernias noted. Genitalia:Dr Mezer, Gyn                                                           Musculoskeletal/extremities: No deformity or scoliosis noted of  the thoracic or lumbar spine. No clubbing, cyanosis, edema, or deformity noted. Range of motion  normal .Tone & strength  normal.Joints normal. Nail health  good. Vascular: Carotid, radial artery, dorsalis pedis and  posterior tibial pulses are full and equal. No bruits present. Neurologic: Alert and oriented x3. Deep tendon reflexes symmetrical and normal.          Skin: Intact without suspicious lesions or rashes. Lymph: No cervical, axillary lymphadenopathy present. Psych: Mood and affect are normal. Normally interactive                                                                                        Assessment & Plan:  #1 comprehensive physical exam; no acute findings #2 see Problem List with Assessments & Recommendations Plan: see Orders

## 2012-02-05 NOTE — Patient Instructions (Addendum)
Preventive Health Care: Exercise  30-45  minutes a day, 3-4 days a week. Walking is especially valuable in preventing Osteoporosis. Eat a low-fat diet with lots of fruits and vegetables, up to 7-9 servings per day.  Consume less than 30 grams of sugar per day from foods & drinks with High Fructose Corn Syrup as # 1,2,3 or #4 on label. To prevent palpitations or premature beats, avoid stimulants such as decongestants, diet pills, nicotine, or caffeine (coffee, tea, cola, or chocolate) to excess. Use an anti-inflammatory cream such as Aspercreme or Zostrix cream twice a day to the knee as needed. In lieu of this warm moist compresses or  hot water bottle can be used. Do not apply ice to the knee. STOP Nonsteroidals at night. Use Arthritis Strength Tylenol at bedtime. Plain Mucinex for thick secretions ;force NON dairy fluids . Use a Neti pot daily as needed for sinus congestion; going from open side to congested side . Nasal cleansing in the shower as discussed. Make sure that all residual soap is removed to prevent irritation. Fluticasone 1 spray in each nostril twice a day as needed. Use the "crossover" technique as discussed. Plain Allegra 160 daily as needed for itchy eyes & sneezing. If you activate My Chart; the results can be released to you as soon as they populate from the lab. If you choose not to use this program; the labs have to be reviewed, copied & mailed   causing a delay in getting the results to you.

## 2012-02-07 ENCOUNTER — Other Ambulatory Visit: Payer: Self-pay | Admitting: Internal Medicine

## 2012-02-07 ENCOUNTER — Encounter: Payer: Self-pay | Admitting: Internal Medicine

## 2012-02-07 DIAGNOSIS — R768 Other specified abnormal immunological findings in serum: Secondary | ICD-10-CM

## 2012-02-07 DIAGNOSIS — M2550 Pain in unspecified joint: Secondary | ICD-10-CM

## 2012-02-10 LAB — VITAMIN D 1,25 DIHYDROXY
Vitamin D 1, 25 (OH)2 Total: 25 pg/mL (ref 18–72)
Vitamin D2 1, 25 (OH)2: <8 pg/mL

## 2012-02-12 ENCOUNTER — Encounter: Payer: Self-pay | Admitting: Internal Medicine

## 2012-04-01 HISTORY — PX: CHOLECYSTECTOMY: SHX55

## 2012-08-28 ENCOUNTER — Other Ambulatory Visit: Payer: Self-pay

## 2012-08-28 ENCOUNTER — Other Ambulatory Visit (INDEPENDENT_AMBULATORY_CARE_PROVIDER_SITE_OTHER): Payer: BC Managed Care – PPO

## 2012-08-28 DIAGNOSIS — E785 Hyperlipidemia, unspecified: Secondary | ICD-10-CM

## 2012-08-28 LAB — LIPID PANEL
Cholesterol: 229 mg/dL — ABNORMAL HIGH (ref 0–200)
Total CHOL/HDL Ratio: 3
Triglycerides: 104 mg/dL (ref 0.0–149.0)
VLDL: 20.8 mg/dL (ref 0.0–40.0)

## 2012-09-04 ENCOUNTER — Encounter: Payer: Self-pay | Admitting: *Deleted

## 2013-02-04 ENCOUNTER — Other Ambulatory Visit: Payer: Self-pay

## 2013-02-16 ENCOUNTER — Telehealth: Payer: Self-pay

## 2013-02-16 NOTE — Telephone Encounter (Signed)
LM with spouse  tdap--12/2007 Shingles--06/2008 CCS--12/2006--next due 2015

## 2013-02-17 ENCOUNTER — Ambulatory Visit (INDEPENDENT_AMBULATORY_CARE_PROVIDER_SITE_OTHER): Payer: Medicare Other | Admitting: Internal Medicine

## 2013-02-17 ENCOUNTER — Encounter: Payer: Self-pay | Admitting: Internal Medicine

## 2013-02-17 VITALS — BP 114/75 | HR 72 | Temp 98.4°F | Ht 66.75 in | Wt 127.2 lb

## 2013-02-17 DIAGNOSIS — E785 Hyperlipidemia, unspecified: Secondary | ICD-10-CM

## 2013-02-17 DIAGNOSIS — M899 Disorder of bone, unspecified: Secondary | ICD-10-CM

## 2013-02-17 DIAGNOSIS — Z0000 Encounter for general adult medical examination without abnormal findings: Secondary | ICD-10-CM

## 2013-02-17 DIAGNOSIS — Z23 Encounter for immunization: Secondary | ICD-10-CM

## 2013-02-17 DIAGNOSIS — M109 Gout, unspecified: Secondary | ICD-10-CM

## 2013-02-17 DIAGNOSIS — M8580 Other specified disorders of bone density and structure, unspecified site: Secondary | ICD-10-CM

## 2013-02-17 NOTE — Patient Instructions (Signed)
Your next office appointment will be determined based upon review of your pending labs. Those instructions will be transmitted to you through My Chart . 

## 2013-02-17 NOTE — Progress Notes (Signed)
Pre visit review using our clinic review tool, if applicable. No additional management support is needed unless otherwise documented below in the visit note. 

## 2013-02-17 NOTE — Progress Notes (Signed)
Subjective:    Patient ID: KANDIE KEIPER, female    DOB: 1945-12-30, 67 y.o.   MRN: [id number]  HPI Medicare Wellness Visit: Psychosocial and medical history were reviewed as required by Medicare (history related to abuse, antisocial behavior , firearm risk). Social history: Caffeine: 1 cup coffee/ day , Alcohol: 7 glasses wine/week , Tobacco ONG:EXBMW Exercise:see below Personal safety/fall risk:no Limitations of activities of daily living:no Seatbelt/ smoke alarm use:yes Healthcare Power of Attorney/Living Will status: in place Ophthalmologic exam status:not current Hearing evaluation status:not current Orientation: Oriented X 3 Memory and recall: good Spelling  testing: good Depression/anxiety assessment: denied Foreign travel history: [place] 2013 Immunization status for influenza/pneumonia/ shingles /tetanus: Flu today Transfusion history:no Preventive health care maintenance status: Colonoscopy/BMD/mammogram/Pap as per protocol/standard care:current Dental care:every 6 mos Chart reviewed and updated. Active issues reviewed and addressed as documented below.    Review of Systems She is on Colcrys for gout which is manifested as soft tissue swelling and pain to the hands and also the foot. Her last uric acid on record was in 2010 with a value of 4.8. She had extensive lab studies performed 10/23/12 which revealed triglycerides of 149. A heart healthy diet is followed; exercise encompasses 60 minutes 3  times per week as CVE without symptoms.  Family history is negative for premature coronary disease. Advanced cholesterol testing reveals  LDL goal is less than 130 ; ideally <100 . To date no statin.  Low dose ASA  Not taken Specifically denied are  chest pain, palpitations, dyspnea, or claudication.       Objective:   Physical Exam Gen.: Thin but healthy and well-nourished in appearance. Alert, appropriate and cooperative throughout exam.Appears younger than stated age   Head: Normocephalic without obvious abnormalities  Eyes: No corneal or conjunctival inflammation noted. Pupils equal round reactive to light and accommodation. Extraocular motion intact.  Ears: External  ear exam reveals no significant lesions or deformities. Canals clear .TMs normal. Hearing is grossly decreased bilaterally. Nose: External nasal exam reveals no deformity or inflammation. Nasal mucosa are pink and moist. No lesions or exudates noted.   Mouth: Oral mucosa and oropharynx reveal no lesions or exudates. Teeth in good repair. Neck: No deformities, masses, or tenderness noted. Range of motion decreased. Thyroid normal. Lungs: Normal respiratory effort; chest expands symmetrically. Lungs are clear to auscultation without rales, wheezes, or increased work of breathing. Heart: Normal rate and rhythm. Normal S1 and S2. No gallop, click, or rub.S4 w/o murmur. Abdomen: Bowel sounds normal; abdomen soft and nontender. No masses, organomegaly or hernias noted. Genitalia:  as per Gyn                                  Musculoskeletal/extremities: No deformity or scoliosis noted of  the thoracic or lumbar spine.   No clubbing, cyanosis, edema, or significant extremity  deformity noted. Range of motion normal .Tone & strength normal. Hand joints normal . Fingernail  health good. Able to lie down & sit up w/o help. Negative SLR bilaterally Vascular: Carotid, radial artery, dorsalis pedis and  posterior tibial pulses are full and equal. No bruits present. Neurologic: Alert and oriented x3. Deep tendon reflexes symmetrical and normal.        Skin: Intact without suspicious lesions or rashes. Lymph: No cervical, axillary lymphadenopathy present. Psych: Mood and affect are normal. Normally interactive  Assessment & Plan:  #1 Medicare Wellness Exam; criteria met ; data entered #2 Problem List/Diagnoses  reviewed Plan:  Assessments made/ Orders entered  

## 2013-02-18 ENCOUNTER — Encounter: Payer: Self-pay | Admitting: Internal Medicine

## 2013-02-18 LAB — TSH: TSH: 1.48 u[IU]/mL (ref 0.35–5.50)

## 2013-02-18 LAB — URIC ACID: Uric Acid, Serum: 5.4 mg/dL (ref 2.4–7.0)

## 2013-02-18 NOTE — Telephone Encounter (Signed)
Unable to reach pre visit.  

## 2013-02-19 LAB — VITAMIN D 1,25 DIHYDROXY
Vitamin D 1, 25 (OH)2 Total: 43 pg/mL (ref 18–72)
Vitamin D3 1, 25 (OH)2: 43 pg/mL

## 2013-03-12 ENCOUNTER — Other Ambulatory Visit: Payer: Self-pay | Admitting: Internal Medicine

## 2013-12-09 ENCOUNTER — Encounter: Payer: Self-pay | Admitting: Internal Medicine

## 2014-02-22 ENCOUNTER — Ambulatory Visit (INDEPENDENT_AMBULATORY_CARE_PROVIDER_SITE_OTHER): Payer: Medicare Other

## 2014-02-22 ENCOUNTER — Encounter: Payer: Self-pay | Admitting: Internal Medicine

## 2014-02-22 ENCOUNTER — Ambulatory Visit (INDEPENDENT_AMBULATORY_CARE_PROVIDER_SITE_OTHER): Payer: Medicare Other | Admitting: Internal Medicine

## 2014-02-22 VITALS — BP 114/74 | HR 64 | Temp 98.2°F | Resp 13 | Ht 67.0 in | Wt 125.0 lb

## 2014-02-22 DIAGNOSIS — D126 Benign neoplasm of colon, unspecified: Secondary | ICD-10-CM

## 2014-02-22 DIAGNOSIS — Z23 Encounter for immunization: Secondary | ICD-10-CM

## 2014-02-22 DIAGNOSIS — M8580 Other specified disorders of bone density and structure, unspecified site: Secondary | ICD-10-CM

## 2014-02-22 DIAGNOSIS — M5416 Radiculopathy, lumbar region: Secondary | ICD-10-CM

## 2014-02-22 DIAGNOSIS — E559 Vitamin D deficiency, unspecified: Secondary | ICD-10-CM

## 2014-02-22 DIAGNOSIS — Z0000 Encounter for general adult medical examination without abnormal findings: Secondary | ICD-10-CM

## 2014-02-22 DIAGNOSIS — E785 Hyperlipidemia, unspecified: Secondary | ICD-10-CM

## 2014-02-22 MED ORDER — GABAPENTIN 100 MG PO CAPS: ORAL_CAPSULE | ORAL | Status: AC

## 2014-02-22 NOTE — Assessment & Plan Note (Signed)
NMR Lipids, LFTs, TSH  

## 2014-02-22 NOTE — Assessment & Plan Note (Signed)
Vitamin D level 

## 2014-02-22 NOTE — Progress Notes (Signed)
   Subjective:    Patient ID: April House, female    DOB: [birth year], 68 y.o.   MRN: [id number]  HPI    Review of Systems     Objective:   Physical Exam   Genitalia :  Should read  "Genitalia as per Gynecologist." "Female: April House as per Gyn " was errata     Assessment & Plan:

## 2014-02-22 NOTE — Progress Notes (Signed)
Subjective:    Patient ID: April House, female    DOB: Jan 23, 1946, 68 y.o.   MRN: [id number]  HPI  UHC/Medicare Wellness Visit: Psychosocial and medical history were reviewed as required by Medicare (history related to abuse, antisocial behavior , firearm risk). Social history: Caffeine: 2 cups/ day & 1 tea/ day , Alcohol: 1 wine / day , Tobacco use: never  Exercise: trainer 3 X / week for 30 min w/o CP symptoms Personal safety/fall risk:no Limitations of activities of daily living:no Seatbelt/ smoke alarm use:yes Healthcare Power of Attorney/Living Will status: UTD Ophthalmologic exam status:UTD Hearing evaluation status:not UTD Orientation: Oriented X 3 Memory and recall: good Spelling testing: good Depression/anxiety assessment: no but husband's illnesses an issue Foreign travel history:2014 [place] Immunization status for influenza/pneumonia/ shingles /tetanus: Prevnar 13 needed; Flu today Transfusion history:no Preventive health care maintenance status: Colonoscopy/BMD/mammogram/Pap as per protocol/standard care:UTD Dental care:every 6 mos Chart reviewed and updated. Active issues reviewed and addressed as documented below.  She takes Gabapentin for right lower extremity lumbar radiculopathy symptoms. This is manifested as drawing of the leg when she's at rest or in bed. This has been effective  She has no significant associated neuromuscular symptoms otherwise  She takes the omeprazole up to 2 weeks at a time as needed. Again she denies significant GI symptoms at this time. She has had a cholecystectomy.    Review of Systems  There is no numbness, tingling, or weakness in extremities.   No loss of control of bladder or bowels.  unexplained weight loss, abdominal pain, significant dyspepsia, dysphagia, melena, rectal bleeding, or persistently small caliber stools.     Objective:   Physical Exam Gen.: Thin but healthy and well-nourished in appearance. Alert,  appropriate and cooperative throughout exam. Appears younger than stated age  Head: Normocephalic without obvious abnormalities  Eyes: No corneal or conjunctival inflammation noted. Pupils minimally unequal round reactive to light and accommodation. Extraocular motion intact. Unsustained vertical nystagmus Ears: External  ear exam reveals no significant lesions or deformities. Canals clear .TMs normal. Hearing is grossly decreased bilaterally. Nose: External nasal exam reveals no deformity or inflammation. Nasal mucosa are pink and moist. No lesions or exudates noted.   Mouth: Oral mucosa and oropharynx reveal no lesions or exudates. Teeth in good repair. Neck: No deformities, masses, or tenderness noted. Range of motion decreased. Thyroid normal. Lungs: Normal respiratory effort; chest expands symmetrically. Lungs are clear to auscultation without rales, wheezes, or increased work of breathing. Heart: Normal rate and rhythm. Normal S1 and S2. No gallop, click, or rub. No murmur. Abdomen: Bowel sounds normal; abdomen soft and nontender. No masses, organomegaly or hernias noted. Genitalia:  FEMALE:Genita as per Gyn                                  Musculoskeletal/extremities: No deformity or scoliosis noted of  the thoracic or lumbar spine. No clubbing, cyanosis, edema, or significant extremity  deformity noted. Range of motion normal .Tone & strength normal. Hand joints normal  Fingernail / toenail health good. Able to lie down & sit up w/o help. Negative SLR bilaterally Vascular: Carotid, radial artery, dorsalis pedis and  posterior tibial pulses are full and equal. No bruits present. Neurologic: Alert and oriented x3. Deep tendon reflexes symmetrical and normal.  Gait normal .       Skin: Intact without suspicious lesions or rashes. Lymph: No cervical, axillary lymphadenopathy present. Psych:  Mood and affect are normal. Normally interactive                                                                                         Assessment & Plan:  See Current Assessment & Plan in Problem List under specific DiagnosisThe labs will be reviewed and risks and options assessed. Written recommendations will be provided by mail or directly through My Chart.Further evaluation or change in medical therapy will be directed by those results.

## 2014-02-22 NOTE — Assessment & Plan Note (Signed)
CBC

## 2014-02-22 NOTE — Progress Notes (Signed)
Pre visit review using our clinic review tool, if applicable. No additional management support is needed unless otherwise documented below in the visit note. 

## 2014-02-22 NOTE — Patient Instructions (Signed)
Your next office appointment will be determined based upon review of your pending labs. Those instructions will be transmitted to you through My Chart . 

## 2014-02-23 ENCOUNTER — Other Ambulatory Visit (INDEPENDENT_AMBULATORY_CARE_PROVIDER_SITE_OTHER): Payer: Medicare Other

## 2014-02-23 ENCOUNTER — Other Ambulatory Visit: Payer: Self-pay | Admitting: Internal Medicine

## 2014-02-23 DIAGNOSIS — M8580 Other specified disorders of bone density and structure, unspecified site: Secondary | ICD-10-CM

## 2014-02-23 DIAGNOSIS — E785 Hyperlipidemia, unspecified: Secondary | ICD-10-CM

## 2014-02-23 DIAGNOSIS — D126 Benign neoplasm of colon, unspecified: Secondary | ICD-10-CM

## 2014-02-23 DIAGNOSIS — E559 Vitamin D deficiency, unspecified: Secondary | ICD-10-CM

## 2014-02-23 LAB — BASIC METABOLIC PANEL
BUN: 12 mg/dL (ref 6–23)
CHLORIDE: 103 meq/L (ref 96–112)
CO2: 27 mEq/L (ref 19–32)
Calcium: 9.1 mg/dL (ref 8.4–10.5)
Creatinine, Ser: 0.8 mg/dL (ref 0.4–1.2)
GFR: 76.27 mL/min (ref >60.00)
Glucose, Bld: 76 mg/dL (ref 70–99)
POTASSIUM: 4 meq/L (ref 3.5–5.1)
SODIUM: 138 meq/L (ref 135–145)

## 2014-02-23 LAB — CBC WITH DIFFERENTIAL/PLATELET
BASOS ABS: 0 10*3/uL (ref 0.0–0.1)
Basophils Relative: 0.3 % (ref 0.0–3.0)
Eosinophils Absolute: 0.1 10*3/uL (ref 0.0–0.7)
Eosinophils Relative: 2.1 % (ref 0.0–5.0)
HEMATOCRIT: 38.7 % (ref 36.0–46.0)
HEMOGLOBIN: 12.7 g/dL (ref 12.0–15.0)
LYMPHS ABS: 1.4 10*3/uL (ref 0.7–4.0)
Lymphocytes Relative: 20.1 % (ref 12.0–46.0)
MCHC: 32.8 g/dL (ref 30.0–36.0)
MCV: 92.1 fl (ref 78.0–100.0)
MONO ABS: 0.5 10*3/uL (ref 0.1–1.0)
MONOS PCT: 6.7 % (ref 3.0–12.0)
NEUTROS ABS: 5.1 10*3/uL (ref 1.4–7.7)
Neutrophils Relative %: 70.8 % (ref 43.0–77.0)
Platelets: 326 10*3/uL (ref 150.0–400.0)
RBC: 4.2 Mil/uL (ref 3.87–5.11)
RDW: 12.9 % (ref 11.5–15.5)
WBC: 7.2 10*3/uL (ref 4.0–10.5)

## 2014-02-23 LAB — HEPATIC FUNCTION PANEL
ALT: 16 U/L (ref 0–35)
AST: 15 U/L (ref 0–37)
Albumin: 3.8 g/dL (ref 3.5–5.2)
Alkaline Phosphatase: 58 U/L (ref 39–117)
Bilirubin, Direct: 0.1 mg/dL (ref 0.0–0.3)
Total Bilirubin: 0.5 mg/dL (ref 0.2–1.2)
Total Protein: 6.5 g/dL (ref 6.0–8.3)

## 2014-02-23 LAB — VITAMIN D 25 HYDROXY (VIT D DEFICIENCY, FRACTURES): VITD: 43.52 ng/mL (ref 30.00–100.00)

## 2014-02-23 LAB — TSH: TSH: 1.9 u[IU]/mL (ref 0.35–4.50)

## 2014-02-26 LAB — NMR LIPOPROFILE WITH LIPIDS
Cholesterol, Total: 195 mg/dL (ref 100–199)
HDL PARTICLE NUMBER: 29.5 umol/L — AB (ref >=30.5)
HDL Size: 9.8 nm (ref >=9.2)
HDL-C: 60 mg/dL (ref >39)
LARGE HDL: 10.8 umol/L (ref >=4.8)
LDL CALC: 113 mg/dL — AB (ref 0–99)
LDL Particle Number: 1337 nmol/L — ABNORMAL HIGH (ref <1000)
LDL SIZE: 21.6 nm (ref >=20.8)
Large VLDL-P: 2 nmol/L (ref <=2.7)
Small LDL Particle Number: 291 nmol/L (ref <=527)
Triglycerides: 108 mg/dL (ref 0–149)
VLDL Size: 43.3 nm (ref <=46.6)

## 2014-03-03 ENCOUNTER — Encounter: Payer: Self-pay | Admitting: Internal Medicine

## 2014-03-30 ENCOUNTER — Encounter: Payer: Self-pay | Admitting: Internal Medicine

## 2014-07-05 ENCOUNTER — Encounter: Payer: Self-pay | Admitting: Internal Medicine

## 2014-09-15 ENCOUNTER — Encounter: Payer: Self-pay | Admitting: Internal Medicine

## 2014-10-26 ENCOUNTER — Ambulatory Visit (AMBULATORY_SURGERY_CENTER): Payer: Self-pay | Admitting: *Deleted

## 2014-10-26 VITALS — Ht 67.0 in | Wt 120.0 lb

## 2014-10-26 DIAGNOSIS — Z86010 Personal history of colonic polyps: Secondary | ICD-10-CM

## 2014-10-26 MED ORDER — NA SULFATE-K SULFATE-MG SULF 17.5-3.13-1.6 GM/177ML PO SOLN: ORAL | Status: DC

## 2014-10-26 NOTE — Progress Notes (Signed)
Patient denies any allergies to eggs or soy. Patient denies any problems with anesthesia/sedation. Patient denies any oxygen use at home and does not take any diet/weight loss medications. EMMI education assisgned to patient on colonoscopy, this was explained and instructions given to patient. 

## 2014-11-09 ENCOUNTER — Encounter: Payer: Self-pay | Admitting: Gastroenterology

## 2014-11-09 ENCOUNTER — Ambulatory Visit (AMBULATORY_SURGERY_CENTER): Payer: Medicare Other | Admitting: Gastroenterology

## 2014-11-09 ENCOUNTER — Encounter: Payer: Medicare Other | Admitting: Internal Medicine

## 2014-11-09 VITALS — BP 104/53 | HR 71 | Temp 96.0°F | Resp 59 | Ht 67.0 in | Wt 120.0 lb

## 2014-11-09 DIAGNOSIS — Z86010 Personal history of colonic polyps: Secondary | ICD-10-CM

## 2014-11-09 DIAGNOSIS — K5140 Inflammatory polyps of colon without complications: Secondary | ICD-10-CM

## 2014-11-09 DIAGNOSIS — K529 Noninfective gastroenteritis and colitis, unspecified: Secondary | ICD-10-CM | POA: Diagnosis not present

## 2014-11-09 MED ORDER — SODIUM CHLORIDE 0.9 % IV SOLN: 500.0000 mL | INTRAVENOUS | Status: DC

## 2014-11-09 MED ORDER — METRONIDAZOLE 250 MG PO TABS: 250.0000 mg | ORAL_TABLET | Freq: Three times a day (TID) | ORAL | Status: AC

## 2014-11-09 MED ORDER — CIPROFLOXACIN HCL 500 MG PO TABS: 500.0000 mg | ORAL_TABLET | Freq: Two times a day (BID) | ORAL | Status: AC

## 2014-11-09 NOTE — Progress Notes (Signed)
  [facility] Post-op Note  Patient: April House  Procedure(s) Performed: colonoscopy  Patient Location: [facility] - Recovery Area  Anesthesia Type: Deep Sedation/Propofol  Level of Consciousness: awake, oriented and patient cooperative  Airway and Oxygen Therapy: Patient Spontanous Breathing  Post-op Pain: none  Post-op Assessment:  Post-op Vital signs reviewed, Patient's Cardiovascular Status Stable, Respiratory Function Stable, Patent Airway, No signs of Nausea or vomiting and Pain level controlled  Post-op Vital Signs: Reviewed and stable  Complications: No apparent anesthesia complications  Nyela Cortinas E 2:04 PM

## 2014-11-09 NOTE — Progress Notes (Signed)
Called to room to assist during endoscopic procedure.  Patient ID and intended procedure confirmed with present staff. Received instructions for my participation in the procedure from the performing physician.  

## 2014-11-09 NOTE — Progress Notes (Signed)
Pt. Mentioned to Dr. Ardis Hughs that she had abdominal pain and fever past weekend.  Cipro 500mg  twice daily x 1 week and Flagyl 250mg  three times Daily for 1 week prescribed.

## 2014-11-09 NOTE — Op Note (Signed)
[facility]  [address] [place], [id number]   COLONOSCOPY PROCEDURE REPORT  PATIENT: April House, April House  MR#: [id number] BIRTHDATE: [birth year] , 67  yrs. old GENDER: female ENDOSCOPIST: Milus Banister, MD PROCEDURE DATE:  11/09/2014 PROCEDURE:   Colonoscopy, surveillance and Colonoscopy with biopsy First Screening Colonoscopy - Avg.  risk and is 50 yrs.  old or older - No.  Prior Negative Screening - Now for repeat screening. N/A  History of Adenoma - Now for follow-up colonoscopy & has been > or = to 3 yrs.  Yes hx of adenoma.  Has been 3 or more years since last colonoscopy.  Recommend repeat exam, <10 yrs? No ASA CLASS:   Class II INDICATIONS:Surveillance due to prior colonic neoplasia and Colonoscopy 2008 [place] (HP polyp only, was recommended 5 year recall); colonoscopy 2001 small TA. MEDICATIONS: Residual sedation present and Propofol 200 mg IV  DESCRIPTION OF PROCEDURE:   After the risks benefits and alternatives of the procedure were thoroughly explained, informed consent was obtained.  The digital rectal exam revealed no abnormalities of the rectum.   The LB [id number]  endoscope was introduced through the anus and advanced to the cecum, which was identified by both the appendix and ileocecal valve. No adverse events experienced.   The quality of the prep was excellent.  The instrument was then slowly withdrawn as the colon was fully examined. Estimated blood loss is zero unless otherwise noted in this procedure report.   COLON FINDINGS: In the sigmoid colon, in a region of dense left sided diverticular disease, there was clean based ulcer that appeared most consistent with chronic diverticulitis.  The mucosa surrounding it was sampled and sent to pathology.  The examination was otherwise normal.  Retroflexed views revealed no abnormalities. The time to cecum = 4.5 Withdrawal time = 9.9   The scope was withdrawn and the procedure  completed. COMPLICATIONS: There were no immediate complications.  ENDOSCOPIC IMPRESSION: In the sigmoid colon, in a region of dense left sided diverticular disease, there was clean based ulcer that appeared most consistent with chronic diverticulitis.  The mucosa surrounding it was sampled and sent to pathology.  The examination was otherwise normal  RECOMMENDATIONS: You should continue to follow colorectal cancer screening guidelines for "routine risk" patients with a repeat colonoscopy in 10 years. Await final pathology from area of diverticulosis, chronic inflammation?  eSigned:  Milus Banister, MD 11/09/2014 2:04 PM   cc: Unice Cobble, MD

## 2014-11-09 NOTE — Patient Instructions (Signed)
YOU HAD AN ENDOSCOPIC PROCEDURE TODAY AT [facility]:   Refer to the procedure report that was given to you for any specific questions about what was found during the examination.  If the procedure report does not answer your questions, please call your gastroenterologist to clarify.  If you requested that your care partner not be given the details of your procedure findings, then the procedure report has been included in a sealed envelope for you to review at your convenience later.  YOU SHOULD EXPECT: Some feelings of bloating in the abdomen. Passage of more gas than usual.  Walking can help get rid of the air that was put into your GI tract during the procedure and reduce the bloating. If you had a lower endoscopy (such as a colonoscopy or flexible sigmoidoscopy) you may notice spotting of blood in your stool or on the toilet paper. If you underwent a bowel prep for your procedure, you may not have a normal bowel movement for a few days.  Please Note:  You might notice some irritation and congestion in your nose or some drainage.  This is from the oxygen used during your procedure.  There is no need for concern and it should clear up in a day or so.  SYMPTOMS TO REPORT IMMEDIATELY:   Following lower endoscopy (colonoscopy or flexible sigmoidoscopy):  Excessive amounts of blood in the stool  Significant tenderness or worsening of abdominal pains  Swelling of the abdomen that is new, acute  Fever of 100F or higher  For urgent or emergent issues, a gastroenterologist can be reached at any hour by calling [phone].   DIET: Your first meal following the procedure should be a small meal and then it is ok to progress to your normal diet. Heavy or fried foods are harder to digest and may make you feel nauseous or bloated.  Likewise, meals heavy in dairy and vegetables can increase bloating.  Drink plenty of fluids but you should avoid alcoholic beverages for 24  hours.  ACTIVITY:  You should plan to take it easy for the rest of today and you should NOT DRIVE or use heavy machinery until tomorrow (because of the sedation medicines used during the test).    FOLLOW UP: Our staff will call the number listed on your records the next business day following your procedure to check on you and address any questions or concerns that you may have regarding the information given to you following your procedure. If we do not reach you, we will leave a message.  However, if you are feeling well and you are not experiencing any problems, there is no need to return our call.  We will assume that you have returned to your regular daily activities without incident.  If any biopsies were taken you will be contacted by phone or by letter within the next 1-3 weeks.  Please call us at [phone] if you have not heard about the biopsies in 3 weeks.    SIGNATURES/CONFIDENTIALITY: You and/or your care partner have signed paperwork which will be entered into your electronic medical record.  These signatures attest to the fact that that the information above on your After Visit Summary has been reviewed and is understood.  Full responsibility of the confidentiality of this discharge information lies with you and/or your care-partner.  Diverticulosis and high fiber diet information given.

## 2014-11-11 ENCOUNTER — Telehealth: Payer: Self-pay

## 2014-11-11 NOTE — Telephone Encounter (Signed)
  Follow up Call-  Call back number 11/09/2014  Post procedure Call Back phone  # [phone]  Permission to leave phone message Yes     Patient questions:  Do you have a fever, pain , or abdominal swelling? No. Pain Score  0 *  Have you tolerated food without any problems? Yes.    Have you been able to return to your normal activities? Yes.    Do you have any questions about your discharge instructions: Diet   No. Medications  No. Follow up visit  No.  Do you have questions or concerns about your Care? No.  Actions: * If pain score is 4 or above: No action needed, pain <4.   Per the pt, she is on antibiotics from that Dr. Ardis Hughs prescribed.  She said she has 1 week left of the antibiotics to take.  She asked how long it took to get biopsies results.  I explained usually receive a letter in 2 - 3 weeks.  She asked if she she would need more antibiotics when she finishes this course.  I told the pt to call Dr. Ardis Hughs back when she finishes this course of antibiotic.  We possibily may have the results of biopsies back and could determine if she needs to continue them or not.  Pt agreed to this. maw

## 2014-11-21 ENCOUNTER — Encounter: Payer: Self-pay | Admitting: Gastroenterology
